# Patient Record
Sex: Male | Born: 1980 | Race: White | Hispanic: No | Marital: Single | Smoking: Current every day smoker
Health system: Southern US, Community
[De-identification: ages and names within clinical notes are randomized; demographics above are authoritative.]

---

## 2013-10-14 ENCOUNTER — Emergency Department (HOSPITAL_COMMUNITY): Payer: Self-pay

## 2013-10-14 ENCOUNTER — Encounter (HOSPITAL_COMMUNITY): Payer: Self-pay | Admitting: Emergency Medicine

## 2013-10-14 ENCOUNTER — Emergency Department (HOSPITAL_COMMUNITY)
Admission: EM | Admit: 2013-10-14 | Discharge: 2013-10-14 | Disposition: A | Discharge status: Home / Self Care | Payer: Self-pay | Attending: Emergency Medicine | Admitting: Emergency Medicine

## 2013-10-14 DIAGNOSIS — Y9389 Activity, other specified: Secondary | ICD-10-CM | POA: Insufficient documentation

## 2013-10-14 DIAGNOSIS — Z79899 Other long term (current) drug therapy: Secondary | ICD-10-CM | POA: Insufficient documentation

## 2013-10-14 DIAGNOSIS — S39012A Strain of muscle, fascia and tendon of lower back, initial encounter: Secondary | ICD-10-CM

## 2013-10-14 DIAGNOSIS — Y92009 Unspecified place in unspecified non-institutional (private) residence as the place of occurrence of the external cause: Secondary | ICD-10-CM | POA: Insufficient documentation

## 2013-10-14 DIAGNOSIS — S0990XA Unspecified injury of head, initial encounter: Secondary | ICD-10-CM | POA: Insufficient documentation

## 2013-10-14 DIAGNOSIS — S335XXA Sprain of ligaments of lumbar spine, initial encounter: Secondary | ICD-10-CM | POA: Insufficient documentation

## 2013-10-14 DIAGNOSIS — Z791 Long term (current) use of non-steroidal anti-inflammatories (NSAID): Secondary | ICD-10-CM | POA: Insufficient documentation

## 2013-10-14 DIAGNOSIS — S1093XA Contusion of unspecified part of neck, initial encounter: Secondary | ICD-10-CM

## 2013-10-14 DIAGNOSIS — W010XXA Fall on same level from slipping, tripping and stumbling without subsequent striking against object, initial encounter: Secondary | ICD-10-CM | POA: Insufficient documentation

## 2013-10-14 DIAGNOSIS — W1809XA Striking against other object with subsequent fall, initial encounter: Secondary | ICD-10-CM | POA: Insufficient documentation

## 2013-10-14 DIAGNOSIS — S0003XA Contusion of scalp, initial encounter: Secondary | ICD-10-CM | POA: Insufficient documentation

## 2013-10-14 DIAGNOSIS — S0083XA Contusion of other part of head, initial encounter: Secondary | ICD-10-CM | POA: Insufficient documentation

## 2013-10-14 DIAGNOSIS — F17200 Nicotine dependence, unspecified, uncomplicated: Secondary | ICD-10-CM | POA: Insufficient documentation

## 2013-10-14 MED ORDER — METHOCARBAMOL 500 MG PO TABS: 500.0000 mg | ORAL_TABLET | Freq: Two times a day (BID) | ORAL | Status: AC | PRN

## 2013-10-14 MED ORDER — HYDROCODONE-ACETAMINOPHEN 5-325 MG PO TABS: 2.0000 | ORAL_TABLET | ORAL | Status: AC | PRN

## 2013-10-14 MED ORDER — NAPROXEN 500 MG PO TABS: 500.0000 mg | ORAL_TABLET | Freq: Two times a day (BID) | ORAL | Status: AC

## 2013-10-14 MED ORDER — HYDROMORPHONE HCL PF 1 MG/ML IJ SOLN
1.0000 mg | Freq: Once | INTRAMUSCULAR | Status: AC
Start: 2013-10-14 — End: 2013-10-14
  Administered 2013-10-14: 1 mg via INTRAMUSCULAR
  Filled 2013-10-14: qty 1

## 2013-10-14 NOTE — ED Provider Notes (Signed)
CSN: [id number]     Arrival date & time 10/14/13  0059 History   None    Chief Complaint  Patient presents with  . Fall  . Back Pain     (Consider location/radiation/quality/duration/timing/severity/associated sxs/prior Treatment) HPI Comments: 33 year old male presents from home after he fell while taking a shower just prior to arrival. He states that initially his feet slipped on the bottom of the bathtub causing him to fall forward and strike his frontal bone at the hairline on the edge of the cut. He did not lose consciousness, he got back up, felt his feet slip and he fell backwards striking the back of his head on the faucet in the bathtub. Again he denies loss of consciousness, denies any bleeding or lacerations. During his second fall, he landed on his lower back and felt acute onset of severe pain which has been persistent and severe without associated numbness or weakness of the lower extremities. He has significant difficulty getting out of the bathtub secondary to pain, required family assistance. No pain medication prior to arrival. No history of injury to his back in the past. At this time he does not have much of a headache, he has no nausea or vomiting, he has no dizziness or blurred vision and has no weakness or numbness of his 4 extremities.  Patient is a 33 y.o. male presenting with fall and back pain. The history is provided by the patient and a parent.  Fall  Back Pain   History reviewed. No pertinent past medical history. History reviewed. No pertinent past surgical history. No family history on file. History  Substance Use Topics  . Smoking status: Current Every Day Smoker    Types: Cigarettes  . Smokeless tobacco: Not on file  . Alcohol Use: No    Review of Systems  Musculoskeletal: Positive for back pain.  All other systems reviewed and are negative.     Allergies  Review of patient's allergies indicates no known allergies.  Home Medications   Prior  to Admission medications   Medication Sig Start Date End Date Taking? Authorizing Provider  HYDROcodone-acetaminophen (NORCO/VICODIN) 5-325 MG per tablet Take 2 tablets by mouth every 4 (four) hours as needed. 10/14/13   Vida Roller, MD  methocarbamol (ROBAXIN) 500 MG tablet Take 1 tablet (500 mg total) by mouth 2 (two) times daily as needed for muscle spasms. 10/14/13   Vida Roller, MD  naproxen (NAPROSYN) 500 MG tablet Take 1 tablet (500 mg total) by mouth 2 (two) times daily with a meal. 10/14/13   Vida Roller, MD   BP 134/96  Pulse 101  Temp(Src) 98.4 F (36.9 C) (Oral)  Resp 22  Ht 5\' 7"  (1.702 m)  Wt 240 lb (108.863 kg)  BMI 37.58 kg/m2  SpO2 96% Physical Exam  Nursing note and vitals reviewed. Constitutional: He appears well-developed and well-nourished. No distress.  HENT:  Head: Normocephalic.  Mouth/Throat: Oropharynx is clear and moist. No oropharyngeal exudate.  No hemotympanum, no malocclusion, no raccoon eyes, no battle sign, no signs of hematoma or laceration to the posterior occiput, 1 cm in diameter minimal hematoma to the frontal bone at the hairline in the mid forehead.  Eyes: Conjunctivae and EOM are normal. Pupils are equal, round, and reactive to light. Right eye exhibits no discharge. Left eye exhibits no discharge. No scleral icterus.  Neck: Normal range of motion. Neck supple. No JVD present. No thyromegaly present.  Very supple neck, no lymphadenopathy thyromegaly or  tracheal deviation, no torticollis or trismus  Cardiovascular: Normal rate, regular rhythm, normal heart sounds and intact distal pulses.  Exam reveals no gallop and no friction rub.   No murmur heard. Pulmonary/Chest: Effort normal and breath sounds normal. No respiratory distress. He has no wheezes. He has no rales. He exhibits no tenderness.  Abdominal: Soft. Bowel sounds are normal. He exhibits no distension and no mass. There is no tenderness.  Musculoskeletal: Normal range of motion. He  exhibits tenderness ( Focal tenderness to palpation over the mid cervical spine as well as the paraspinal muscles in the area, tenderness over the lumbar spine, sacrum and the paraspinal muscles of the lumbar area). He exhibits no edema.  No deformity of the 4 extremities, compartments are soft, joints are supple  Lymphadenopathy:    He has no cervical adenopathy.  Neurological: He is alert. Coordination normal.  Neurologic exam with normal range of motion of all 4 extremities, normal strength, movement of the bilateral lower extremities exacerbates back pain but when isolated at the joint has normal strength and sensation.  Skin: Skin is warm and dry. No rash noted. No erythema.  No obvious extremity injuries, no redness, no hematomas, no lacerations  Psychiatric: He has a normal mood and affect. His behavior is normal.    ED Course  Procedures (including critical care time) Labs Review Labs Reviewed - No data to display  Imaging Review Dg Cervical Spine Complete  10/14/2013   CLINICAL DATA:  Fall, neck pain.  EXAM: CERVICAL SPINE  4+ VIEWS  COMPARISON:  None.  FINDINGS: Mild straightening of the normal cervical lordosis is present, which may be related to patient positioning. No listhesis. Vertebral body heights are maintained. Normal C1-2 articulations are intact. Prevertebral soft tissues are normal. No acute fracture or listhesis.  No significant degenerative disc disease identified within the cervical spine. No canal or neural foraminal stenosis.  Visualized soft tissues are within normal limits.  IMPRESSION: Negative cervical spine radiographs.   Electronically Signed   By: Rise Mu M.D.   On: 10/14/2013 03:25   Dg Lumbar Spine Complete  10/14/2013   CLINICAL DATA:  Fall, low back pain  EXAM: LUMBAR SPINE - COMPLETE 4+ VIEW  COMPARISON:  None.  FINDINGS: Five non rib-bearing lumbar type vertebral bodies are present. The vertebral bodies are normally aligned with preservation  of the normal lumbar lordosis. Vertebral body heights are well maintained. No acute fracture or or listhesis.  Mild degenerative endplate spurring seen anteriorly at the L1 and L2 levels. No other significant degenerative changes.  No paraspinous soft tissue abnormality.  IMPRESSION: No acute fracture or listhesis within the lumbar spine.   Electronically Signed   By: Rise Mu M.D.   On: 10/14/2013 03:21   Dg Sacrum/coccyx  10/14/2013   CLINICAL DATA:  Fall, low back pain.  EXAM: SACRUM AND COCCYX - 2+ VIEW  COMPARISON:  None.  FINDINGS: There is no evidence of fracture or other focal bone lesions  IMPRESSION: No acute fracture or dislocation.   Electronically Signed   By: Rise Mu M.D.   On: 10/14/2013 03:22     MDM   Final diagnoses:  Lumbar strain  Contusion of scalp  Head injury    The patient has had trauma to his head as well as his lower back. He has not had any neurologic symptoms, low risk for intracranial hemorrhage given the mechanism, imaging of his cervical and lumbar spines as well as the sacrum has been  ordered, pain medication ordered, rule out fracture.  Playfully imaging of the lumbar spine, cervical spine and sacrum show no signs of fractures. Patient improved medications, he will be given a prescriptions as below and encouraged followup outpatient. Resource list given.   Meds given in ED:  Medications  HYDROmorphone (DILAUDID) injection 1 mg (1 mg Intramuscular Given 10/14/13 0132)    New Prescriptions   HYDROCODONE-ACETAMINOPHEN (NORCO/VICODIN) 5-325 MG PER TABLET    Take 2 tablets by mouth every 4 (four) hours as needed.   METHOCARBAMOL (ROBAXIN) 500 MG TABLET    Take 1 tablet (500 mg total) by mouth 2 (two) times daily as needed for muscle spasms.   NAPROXEN (NAPROSYN) 500 MG TABLET    Take 1 tablet (500 mg total) by mouth 2 (two) times daily with a meal.      Vida RollerBrian D Karris Deangelo, MD 10/14/13 [phone]

## 2013-10-14 NOTE — Discharge Instructions (Signed)
Back Pain: ° ° °Your back pain should be treated with medicines such as ibuprofen or aleve and this back pain should get better over the next 2 weeks.  However if you develop severe or worsening pain, low back pain with fever, numbness, weakness or inability to walk or urinate, you should return to the ER immediately.  Please follow up with your doctor this week for a recheck if still having symptoms. °Low back pain is discomfort in the lower back that may be due to injuries to muscles and ligaments around the spine.  Occasionally, it may be caused by a a problem to a part of the spine called a disc.  The pain may last several days or a week;  However, most patients get completely well in 4 weeks. ° °Self - care:  The application of heat can help soothe the pain.  Maintaining your daily activities, including walking, is encourged, as it will help you get better faster than just staying in bed. ° °Medications are also useful to help with pain control.  A commonly prescribed medications includes acetaminophen.  This medication is generally safe, though you should not take more than 8 of the extra strength (500mg) pills a day. ° °Non steroidal anti inflammatory medications including Ibuprofen and naproxen;  These medications help both pain and swelling and are very useful in treating back pain.  They should be taken with food, as they can cause stomach upset, and more seriously, stomach bleeding.   ° °Muscle relaxants:  These medications can help with muscle tightness that is a cause of lower back pain.  Most of these medications can cause drowsiness, and it is not safe to drive or use dangerous machinery while taking them. ° °You will need to follow up with  Your primary healthcare provider in 1-2 weeks for reassessment. ° °Be aware that if you develop new symptoms, such as a fever, leg weakness, difficulty with or loss of control of your urine or bowels, abdominal pain, or more severe pain, you will need to seek  medical attention and  / or return to the Emergency department. ° °If you do not have a doctor see the list below. ° °[facility] Doctor List ° ° ° °Edward Hawkins MD. Specialty: Pulmonary Disease Contact information: [address]  °[address]  °[place] [address]  °[phone]  ° °Margaret Simpson, MD. Specialty: Family Medicine Contact information: [address]  °[place] [address]  °[phone]  ° °Scott Luking, MD. Specialty: Family Medicine Contact information: [address]  °[address]  °[place] [address]  °[phone]  ° °Tesfaye Fanta, MD Specialty: Internal Medicine Contact information: [address]  °[place] [address]  °[phone]  ° °Zach Hall, MD. Specialty: Internal Medicine Contact information: [address]  °[place] [address]  °[phone]  ° °Angus Mcinnis, MD. Specialty: Family Medicine Contact information: [address]  °[place] [address]  °[phone]  ° °Stephen Knowlton, MD. Specialty: Family Medicine Contact information: [address]  °[address]  °[place] [address]  °[phone]  ° °Roy Fagan, MD. Specialty: Internal Medicine Contact information: [address]  °[address]  °[place] [address]  [phone]  ° ° ° °

## 2013-10-14 NOTE — ED Notes (Signed)
I was taking a shower and fell face first in the shower. Hit head over the faucet. Having pain in lower back in my head.

## 2014-04-25 ENCOUNTER — Emergency Department (HOSPITAL_COMMUNITY)
Admission: EM | Admit: 2014-04-25 | Discharge: 2014-04-25 | Disposition: A | Discharge status: Home / Self Care | Payer: Self-pay | Attending: Emergency Medicine | Admitting: Emergency Medicine

## 2014-04-25 ENCOUNTER — Encounter (HOSPITAL_COMMUNITY): Payer: Self-pay | Admitting: *Deleted

## 2014-04-25 DIAGNOSIS — L02213 Cutaneous abscess of chest wall: Secondary | ICD-10-CM | POA: Insufficient documentation

## 2014-04-25 DIAGNOSIS — Z79899 Other long term (current) drug therapy: Secondary | ICD-10-CM | POA: Insufficient documentation

## 2014-04-25 DIAGNOSIS — Z791 Long term (current) use of non-steroidal anti-inflammatories (NSAID): Secondary | ICD-10-CM | POA: Insufficient documentation

## 2014-04-25 DIAGNOSIS — Z720 Tobacco use: Secondary | ICD-10-CM | POA: Insufficient documentation

## 2014-04-25 DIAGNOSIS — J869 Pyothorax without fistula: Secondary | ICD-10-CM

## 2014-04-25 DIAGNOSIS — R000 Tachycardia, unspecified: Secondary | ICD-10-CM | POA: Insufficient documentation

## 2014-04-25 MED ORDER — SODIUM BICARBONATE 4 % IV SOLN
5.0000 mL | Freq: Once | INTRAVENOUS | Status: AC
  Administered 2014-04-25: 5 mL via SUBCUTANEOUS
  Filled 2014-04-25: qty 5

## 2014-04-25 MED ORDER — OXYCODONE-ACETAMINOPHEN 5-325 MG PO TABS: 1.0000 | ORAL_TABLET | Freq: Four times a day (QID) | ORAL | Status: AC | PRN

## 2014-04-25 MED ORDER — LIDOCAINE HCL (PF) 1 % IJ SOLN
5.0000 mL | Freq: Once | INTRAMUSCULAR | Status: AC
  Administered 2014-04-25: 5 mL
  Filled 2014-04-25: qty 5

## 2014-04-25 MED ORDER — SULFAMETHOXAZOLE-TRIMETHOPRIM 800-160 MG PO TABS: 1.0000 | ORAL_TABLET | Freq: Three times a day (TID) | ORAL | Status: AC

## 2014-04-25 NOTE — ED Provider Notes (Signed)
CSN: [id number]     Arrival date & time 04/25/14  1818 History   First MD Initiated Contact with Patient 04/25/14 1929     No chief complaint on file.    (Consider location/radiation/quality/duration/timing/severity/associated sxs/prior Treatment) The history is provided by the patient.   patient with previous abscesses on his chest area. For the last 10 days he's had pain and swelling over his lower chest. It has become more painful. States he has had some chills. No relief with medications at home. He states that he has been told in the past she does not have it surgically removed he will keep getting these abscesses.  History reviewed. No pertinent past medical history. History reviewed. No pertinent past surgical history. History reviewed. No pertinent family history. History  Substance Use Topics  . Smoking status: Current Every Day Smoker    Types: Cigarettes  . Smokeless tobacco: Not on file  . Alcohol Use: No    Review of Systems  Constitutional: Positive for chills. Negative for fever.  Respiratory: Negative for shortness of breath.   Cardiovascular: Positive for chest pain.  Gastrointestinal: Negative for abdominal pain.  Skin: Positive for wound.      Allergies  Review of patient's allergies indicates no known allergies.  Home Medications   Prior to Admission medications   Medication Sig Start Date End Date Taking? Authorizing Provider  HYDROcodone-acetaminophen (NORCO/VICODIN) 5-325 MG per tablet Take 2 tablets by mouth every 4 (four) hours as needed. 10/14/13   Vida RollerBrian D Miller, MD  methocarbamol (ROBAXIN) 500 MG tablet Take 1 tablet (500 mg total) by mouth 2 (two) times daily as needed for muscle spasms. 10/14/13   Vida RollerBrian D Miller, MD  naproxen (NAPROSYN) 500 MG tablet Take 1 tablet (500 mg total) by mouth 2 (two) times daily with a meal. 10/14/13   Vida RollerBrian D Miller, MD  oxyCODONE-acetaminophen (PERCOCET/ROXICET) 5-325 MG per tablet Take 1-2 tablets by mouth every 6  (six) hours as needed for severe pain. 04/25/14   Juliet RudeNathan R. Sharisse Rantz, MD  sulfamethoxazole-trimethoprim (BACTRIM DS,SEPTRA DS) 800-160 MG per tablet Take 1 tablet by mouth 3 (three) times daily. 04/25/14   Juliet RudeNathan R. Barb Shear, MD   BP 139/86 mmHg  Pulse 115  Temp(Src) 99 F (37.2 C) (Oral)  Resp 20  Ht 5\' 7"  (1.702 m)  Wt 224 lb (101.606 kg)  BMI 35.08 kg/m2  SpO2 99% Physical Exam  Constitutional: He appears well-developed and well-nourished.  Cardiovascular:  Mild tachycardia   Pulmonary/Chest: He exhibits tenderness.  Abdominal: There is no tenderness.  Neurological: He is alert.  Skin:  Approximately 3-4 cm tender swollen fluctuant area to lower midline chest/epigastric area. There is some fluctuance superior to the mass also. It is erythematous along the swollen area.    ED Course  Procedures (including critical care time) Labs Review Labs Reviewed - No data to display  Imaging Review No results found.   EKG Interpretation None      MDM   Final diagnoses:  Abscess of chest    Patient with abscess on his chest. Drained. Small amount of packing placed. There was still surrounding tenderness and erythema after the drainage and antibiotics will be given for presumed MRSA infection.  INCISION AND DRAINAGE Performed by: Billee CashingPICKERING,Jayveon Convey R. Consent: Verbal consent obtained. Risks and benefits: risks, benefits and alternatives were discussed Type: abscess  Body area: chest  Anesthesia: local infiltration  Incision was made with a scalpel.  Local anesthetic: lidocaine 1% without epinephrine with Neur  Anesthetic  total: 2 ml infiltration with 3 mL in abscess both before and after drainage  Complexity: complex Blunt dissection to break up loculations  Drainage: purulent  Drainage amount: moderate  Packing material: 1/4 in iodoform gauze  Patient tolerance: Patient tolerated the procedure well with no immediate complications.      Juliet RudeNathan R.  Rubin PayorPickering, MD 04/25/14 2127

## 2014-04-25 NOTE — Discharge Instructions (Signed)
Take the packing out in 2-3 days.  Abscess An abscess is an infected area that contains a collection of pus and debris.It can occur in almost any part of the body. An abscess is also known as a furuncle or boil. CAUSES  An abscess occurs when tissue gets infected. This can occur from blockage of oil or sweat glands, infection of hair follicles, or a minor injury to the skin. As the body tries to fight the infection, pus collects in the area and creates pressure under the skin. This pressure causes pain. People with weakened immune systems have difficulty fighting infections and get certain abscesses more often.  SYMPTOMS Usually an abscess develops on the skin and becomes a painful mass that is red, warm, and tender. If the abscess forms under the skin, you may feel a moveable soft area under the skin. Some abscesses break open (rupture) on their own, but most will continue to get worse without care. The infection can spread deeper into the body and eventually into the bloodstream, causing you to feel ill.  DIAGNOSIS  Your caregiver will take your medical history and perform a physical exam. A sample of fluid may also be taken from the abscess to determine what is causing your infection. TREATMENT  Your caregiver may prescribe antibiotic medicines to fight the infection. However, taking antibiotics alone usually does not cure an abscess. Your caregiver may need to make a small cut (incision) in the abscess to drain the pus. In some cases, gauze is packed into the abscess to reduce pain and to continue draining the area. HOME CARE INSTRUCTIONS   Only take over-the-counter or prescription medicines for pain, discomfort, or fever as directed by your caregiver.  If you were prescribed antibiotics, take them as directed. Finish them even if you start to feel better.  If gauze is used, follow your caregiver's directions for changing the gauze.  To avoid spreading the infection:  Keep your draining  abscess covered with a bandage.  Wash your hands well.  Do not share personal care items, towels, or whirlpools with others.  Avoid skin contact with others.  Keep your skin and clothes clean around the abscess.  Keep all follow-up appointments as directed by your caregiver. SEEK MEDICAL CARE IF:   You have increased pain, swelling, redness, fluid drainage, or bleeding.  You have muscle aches, chills, or a general ill feeling.  You have a fever. MAKE SURE YOU:   Understand these instructions.  Will watch your condition.  Will get help right away if you are not doing well or get worse. Document Released: 02/12/2005 Document Revised: 11/04/2011 Document Reviewed: 07/18/2011 [facility] Patient Information 2015 [employer], [place]. This information is not intended to replace advice given to you by your health care provider. Make sure you discuss any questions you have with your health care provider.

## 2014-04-25 NOTE — ED Notes (Signed)
Pt has a abscess on the center of his chest.

## 2016-01-06 IMAGING — CR DG LUMBAR SPINE COMPLETE 4+V
5 series · 5 of 5 positions shown · non-contrast
Comparison: None.

CLINICAL DATA: Fall, low back pain

EXAM:
LUMBAR SPINE - COMPLETE 4+ VIEW

[view not recorded (1 of 5)]
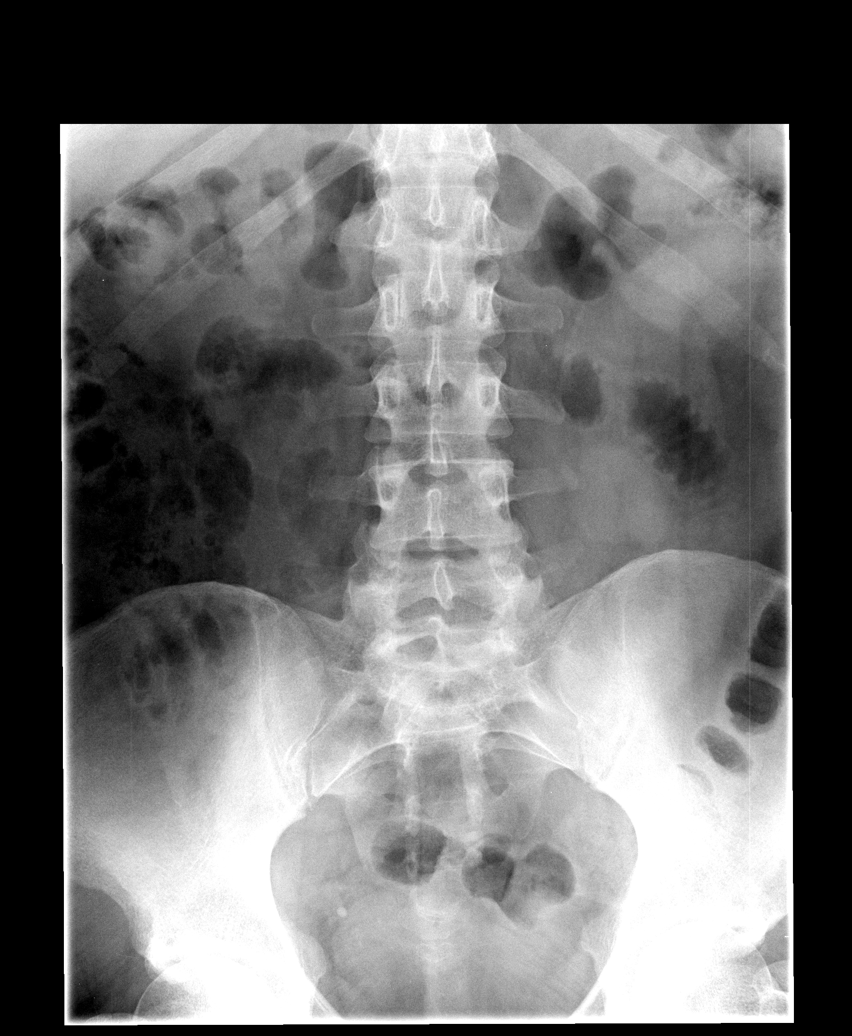

[view not recorded (2 of 5)]
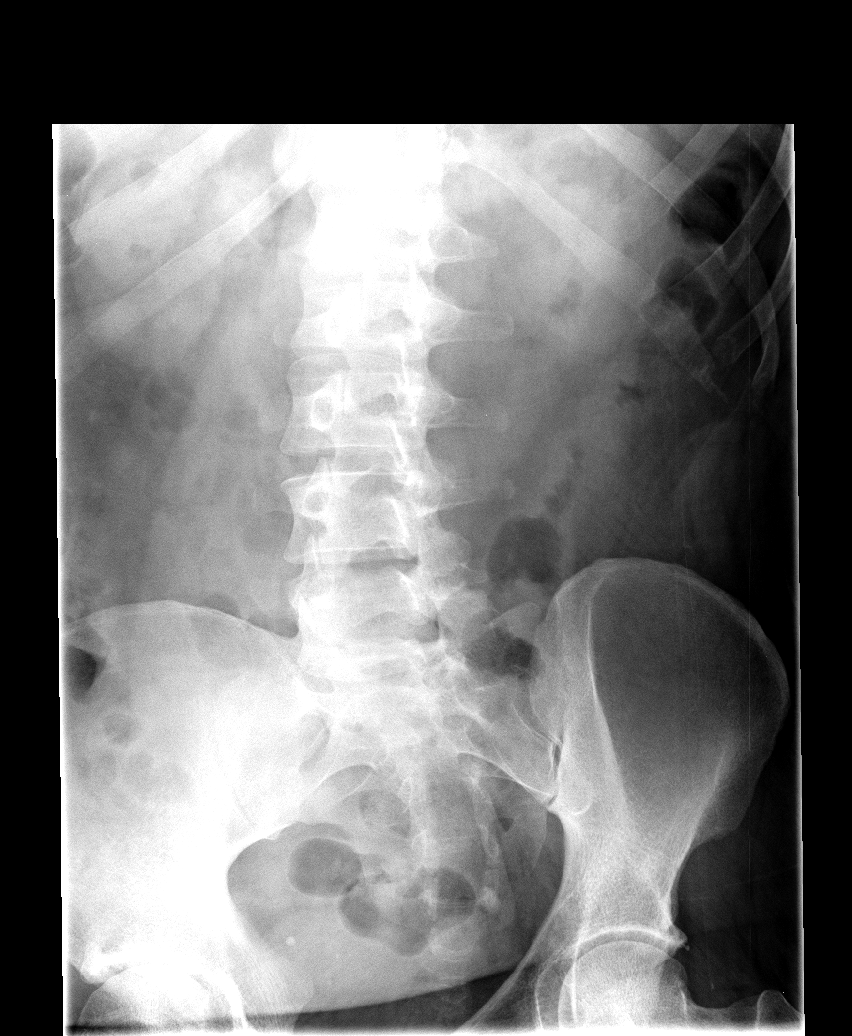

[view not recorded (3 of 5)]
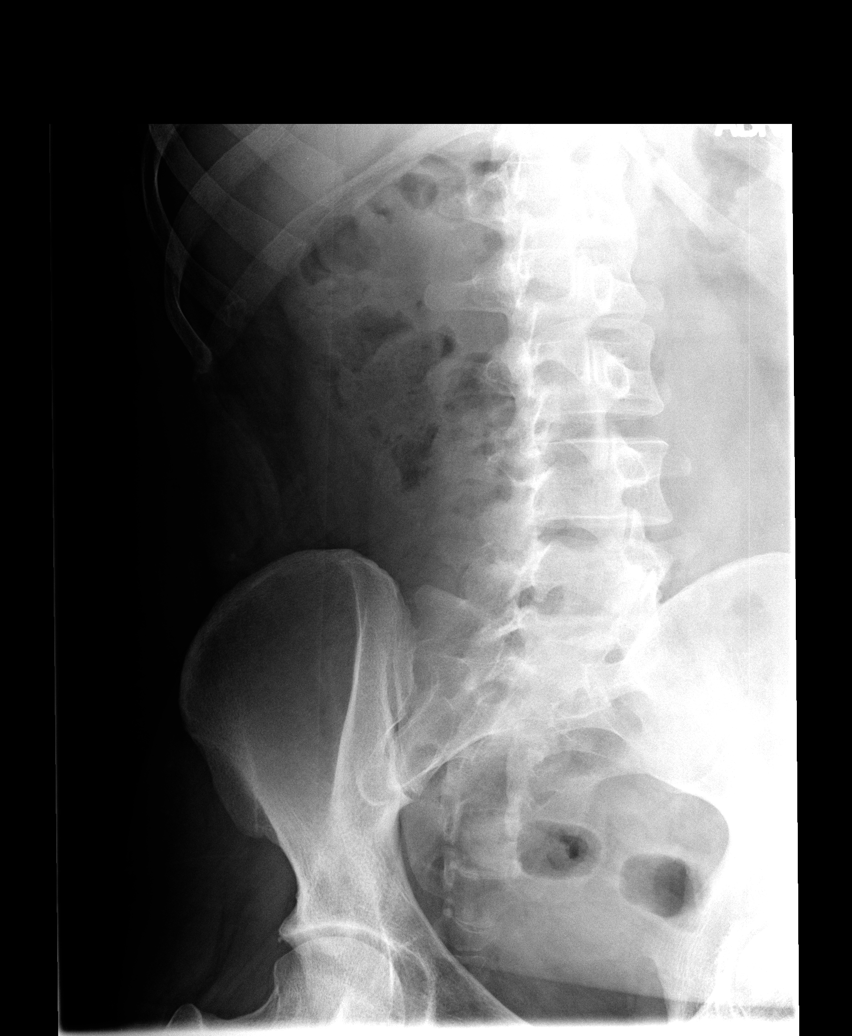

[view not recorded (4 of 5)]
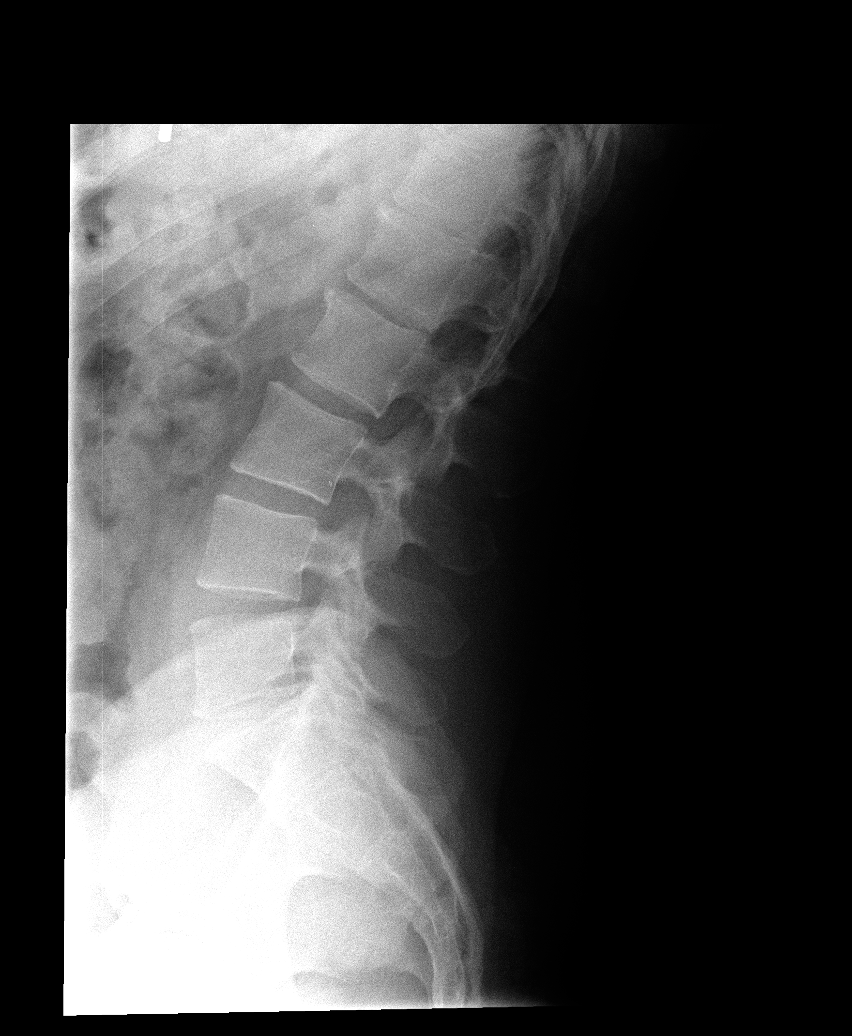

[view not recorded (5 of 5)]
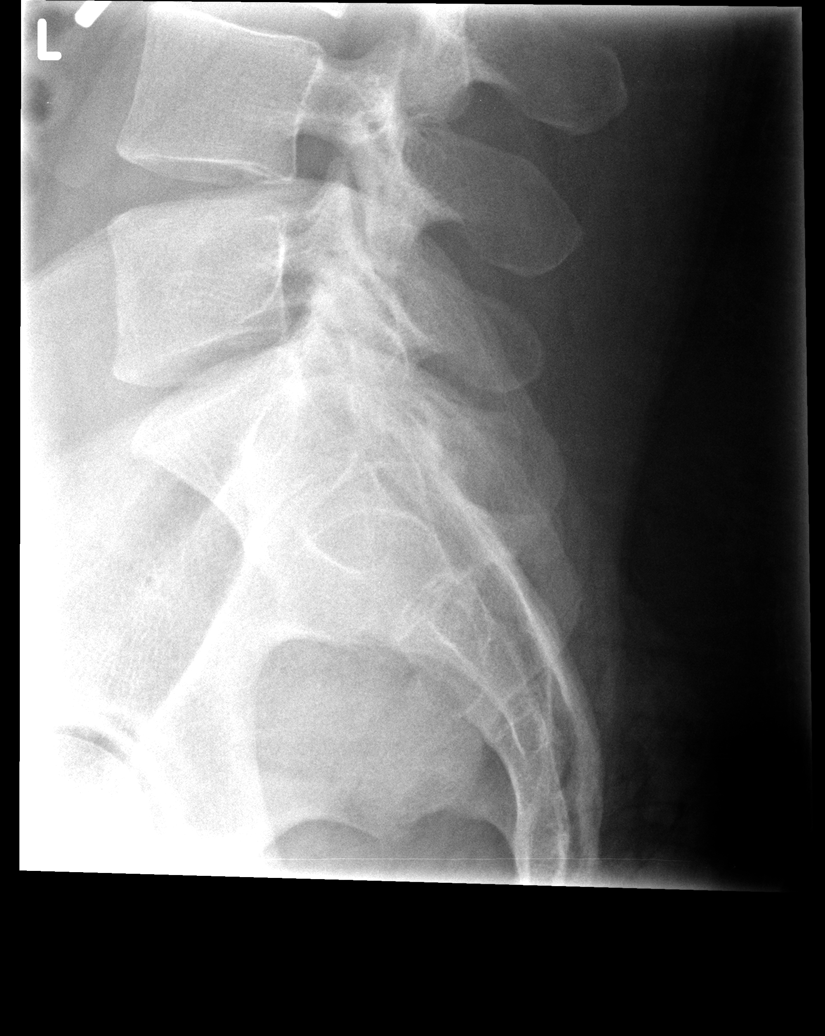

[5 of 5 positions shown; findings below may reference images not displayed]

FINDINGS: Five non rib-bearing lumbar type vertebral bodies are present. The
vertebral bodies are normally aligned with preservation of the
normal lumbar lordosis. Vertebral body heights are well maintained.
No acute fracture or or listhesis.

Mild degenerative endplate spurring seen anteriorly at the L1 and L2
levels. No other significant degenerative changes.

No paraspinous soft tissue abnormality.
IMPRESSION: No acute fracture or listhesis within the lumbar spine.

## 2016-01-06 IMAGING — CR DG CERVICAL SPINE COMPLETE 4+V
7 series · 7 of 7 positions shown · non-contrast
Comparison: None.

CLINICAL DATA: Fall, neck pain.

EXAM:
CERVICAL SPINE  4+ VIEWS

[view not recorded (1 of 7)]
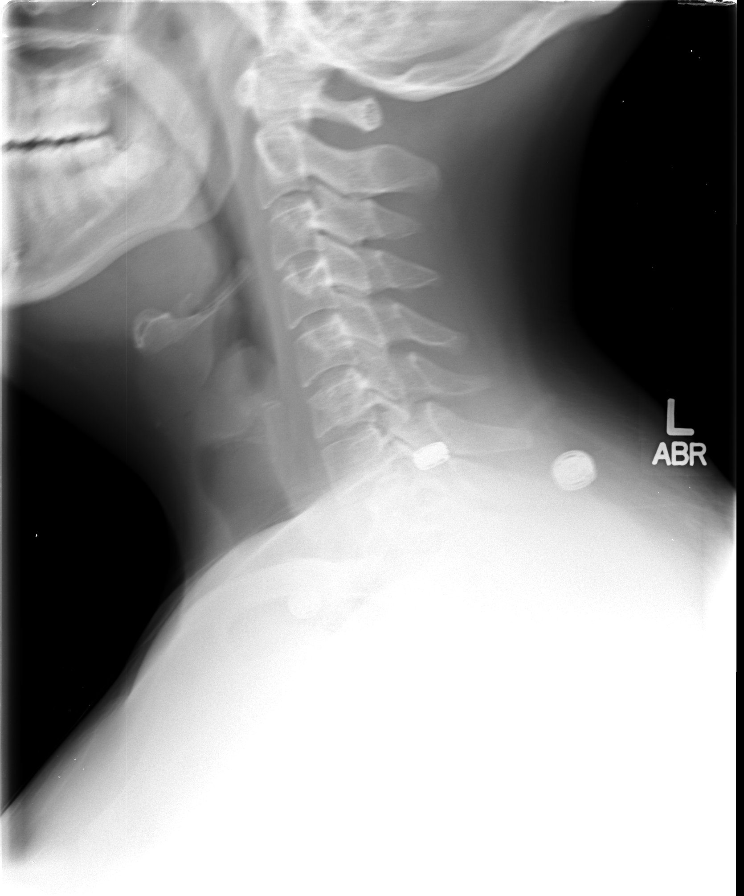

[view not recorded (2 of 7)]
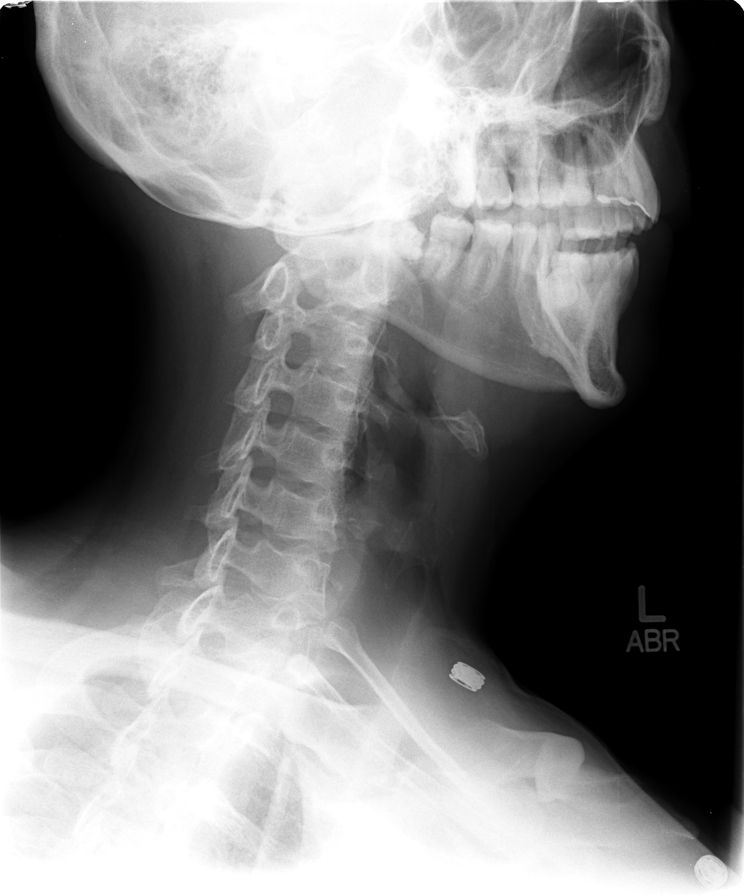

[view not recorded (3 of 7)]
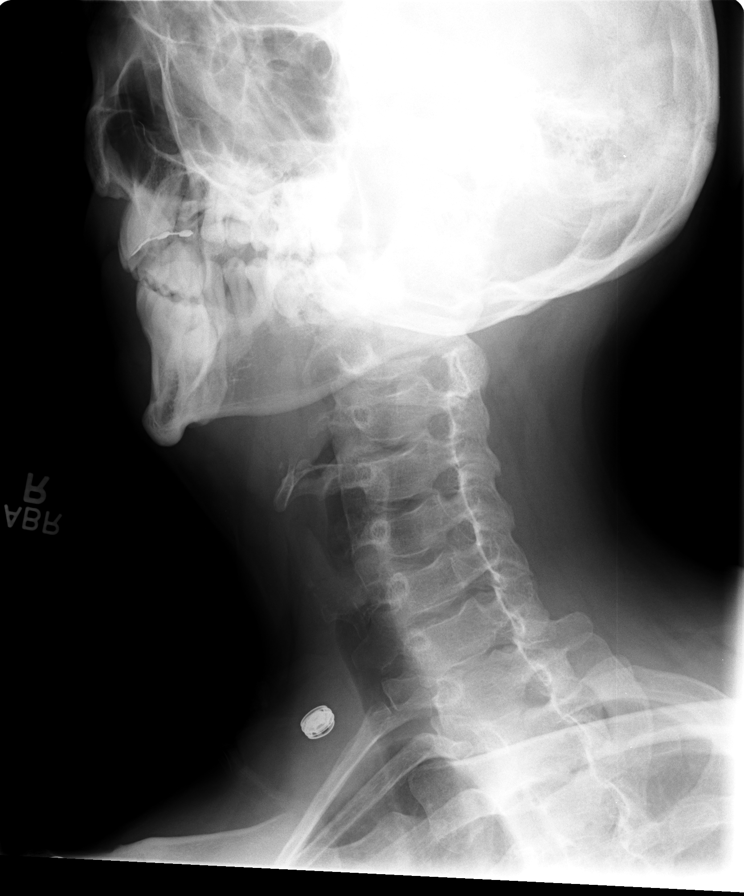

[view not recorded (4 of 7)]
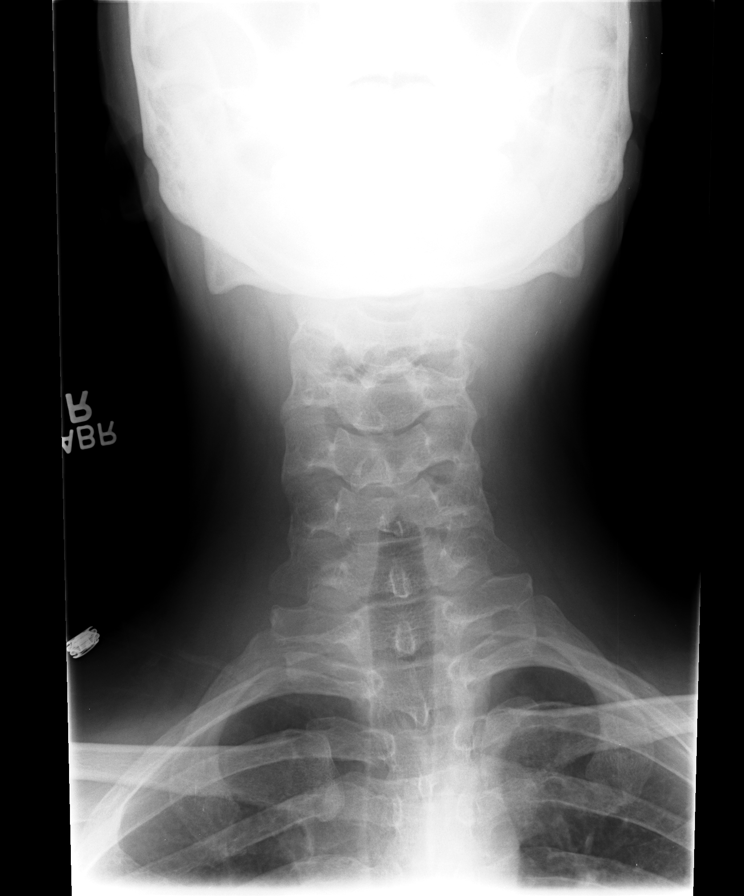

[view not recorded (5 of 7)]
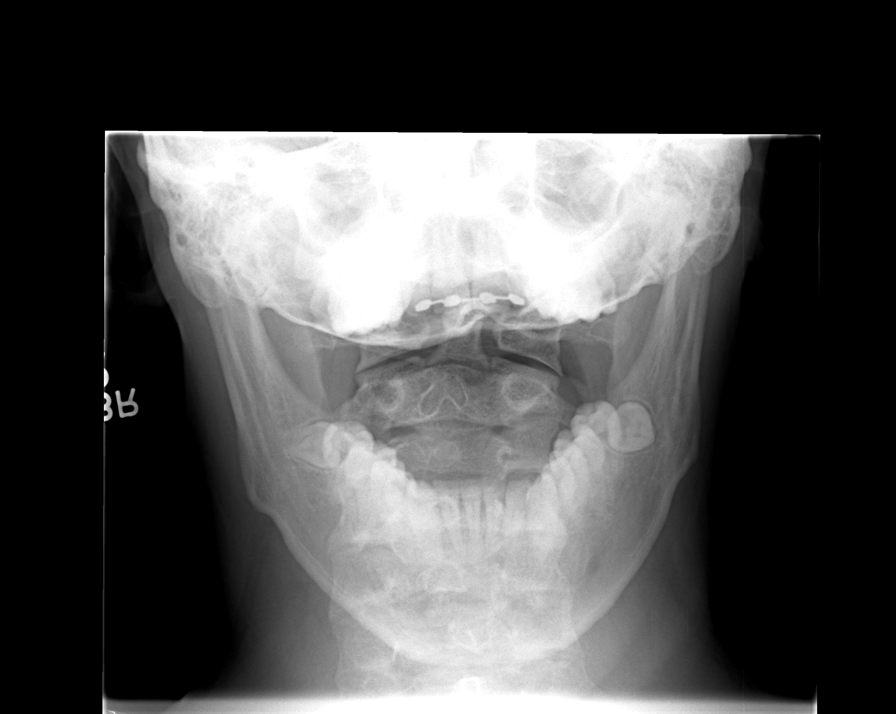

[view not recorded (6 of 7)]
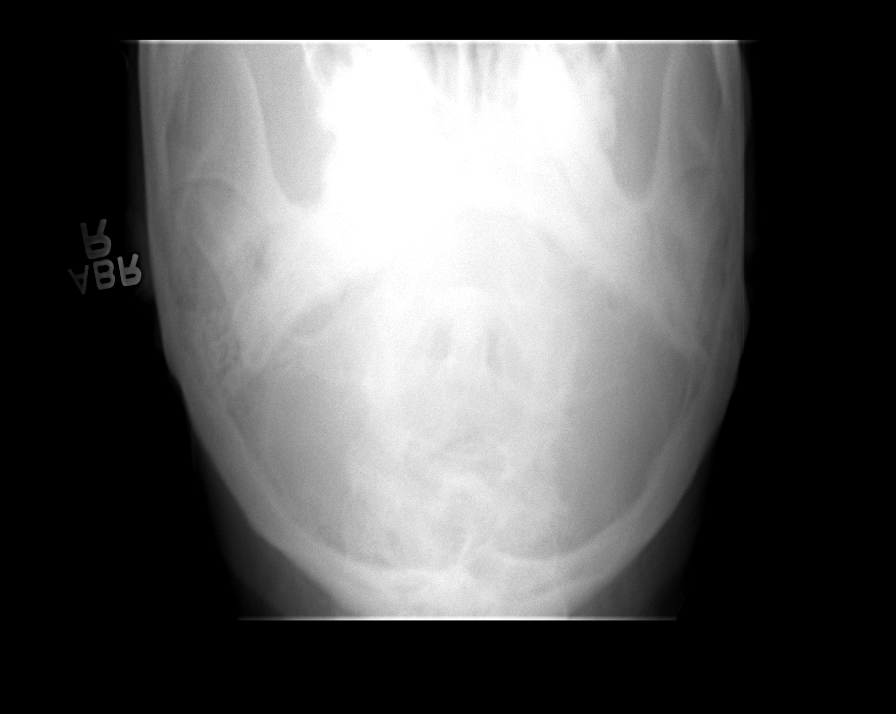

[view not recorded (7 of 7)]
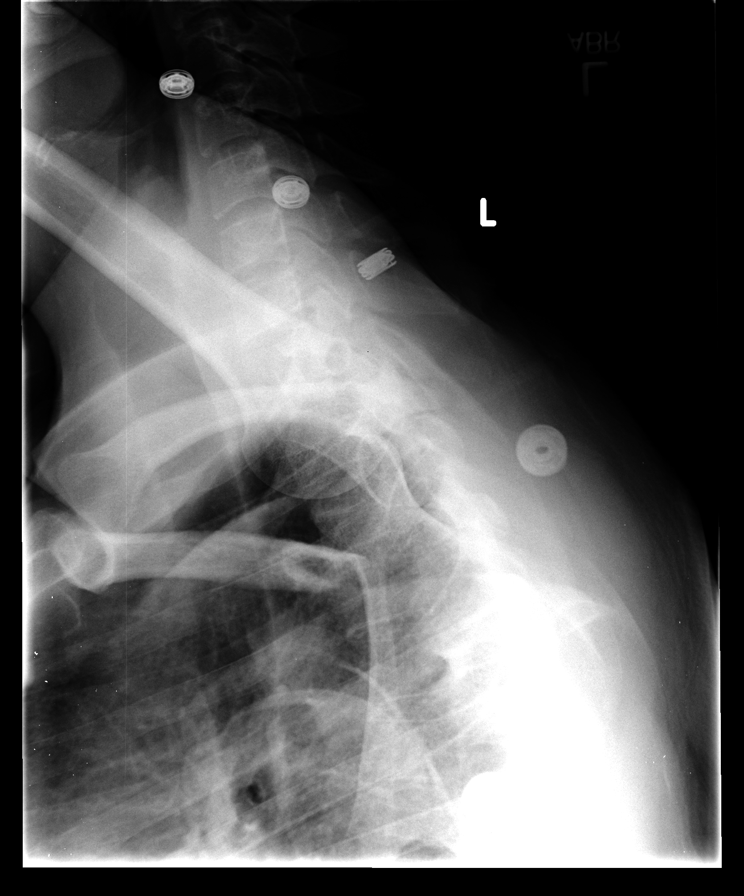

[7 of 7 positions shown; findings below may reference images not displayed]

FINDINGS: Mild straightening of the normal cervical lordosis is present, which
may be related to patient positioning. No listhesis. Vertebral body
heights are maintained. Normal C1-2 articulations are intact.
Prevertebral soft tissues are normal. No acute fracture or
listhesis.

No significant degenerative disc disease identified within the
cervical spine. No canal or neural foraminal stenosis.

Visualized soft tissues are within normal limits.
IMPRESSION: Negative cervical spine radiographs.

## 2020-07-17 DEATH — deceased
